# Patient Record
Sex: Female | Born: 1990 | Race: Black or African American | Hispanic: No | Marital: Single | Smoking: Never smoker
Health system: Southern US, Community
[De-identification: ages and names within clinical notes are randomized; demographics above are authoritative.]

---

## 2014-03-14 ENCOUNTER — Emergency Department (HOSPITAL_COMMUNITY)
Admission: EM | Admit: 2014-03-14 | Discharge: 2014-03-15 | Disposition: A | Discharge status: Home / Self Care | Payer: BC Managed Care – PPO | Attending: Emergency Medicine | Admitting: Emergency Medicine

## 2014-03-14 ENCOUNTER — Encounter (HOSPITAL_COMMUNITY): Payer: Self-pay

## 2014-03-14 DIAGNOSIS — S01112A Laceration without foreign body of left eyelid and periocular area, initial encounter: Secondary | ICD-10-CM | POA: Insufficient documentation

## 2014-03-14 DIAGNOSIS — S0280XA Fracture of other specified skull and facial bones, unspecified side, initial encounter for closed fracture: Secondary | ICD-10-CM

## 2014-03-14 DIAGNOSIS — S028XXA Fractures of other specified skull and facial bones, initial encounter for closed fracture: Secondary | ICD-10-CM | POA: Diagnosis not present

## 2014-03-14 DIAGNOSIS — IMO0002 Reserved for concepts with insufficient information to code with codable children: Secondary | ICD-10-CM

## 2014-03-14 DIAGNOSIS — S022XXA Fracture of nasal bones, initial encounter for closed fracture: Secondary | ICD-10-CM | POA: Diagnosis not present

## 2014-03-14 DIAGNOSIS — S0083XA Contusion of other part of head, initial encounter: Secondary | ICD-10-CM

## 2014-03-14 DIAGNOSIS — T1490XA Injury, unspecified, initial encounter: Secondary | ICD-10-CM

## 2014-03-14 DIAGNOSIS — S0285XA Fracture of orbit, unspecified, initial encounter for closed fracture: Secondary | ICD-10-CM

## 2014-03-14 MED ORDER — LIDOCAINE HCL 2 % IJ SOLN
20.0000 mL | Freq: Once | INTRAMUSCULAR | Status: DC
  Filled 2014-03-14 (×2): qty 20
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
## 2014-03-15 ENCOUNTER — Emergency Department (HOSPITAL_COMMUNITY): Payer: BC Managed Care – PPO

## 2014-03-15 MED ORDER — OXYCODONE-ACETAMINOPHEN 5-325 MG PO TABS
2.0000 | ORAL_TABLET | Freq: Once | ORAL | Status: AC
  Administered 2014-03-15: 2 via ORAL
  Filled 2014-03-15: qty 2

## 2014-03-15 MED ORDER — CEPHALEXIN 250 MG PO CAPS
250.0000 mg | ORAL_CAPSULE | Freq: Once | ORAL | Status: AC
  Administered 2014-03-15: 250 mg via ORAL
  Filled 2014-03-15: qty 1

## 2014-03-15 MED ORDER — OXYCODONE-ACETAMINOPHEN 5-325 MG PO TABS: 1.0000 | ORAL_TABLET | Freq: Four times a day (QID) | ORAL | Status: AC | PRN

## 2014-03-15 MED ORDER — CEPHALEXIN 250 MG PO CAPS: 250.0000 mg | ORAL_CAPSULE | Freq: Three times a day (TID) | ORAL | Status: AC

## 2014-03-15 NOTE — Discharge Instructions (Signed)
Assault, General °Assault includes any behavior, whether intentional or reckless, which results in bodily injury to another person and/or damage to property. Included in this would be any behavior, intentional or reckless, that by its nature would be understood (interpreted) by a reasonable person as intent to harm another person or to damage his/her property. Threats may be oral or written. They may be communicated through regular mail, computer, fax, or phone. These threats may be direct or implied. °FORMS OF ASSAULT INCLUDE: °· Physically assaulting a person. This includes physical threats to inflict physical harm as well as: °¨ Slapping. °¨ Hitting. °¨ Poking. °¨ Kicking. °¨ Punching. °¨ Pushing. °· Arson. °· Sabotage. °· Equipment vandalism. °· Damaging or destroying property. °· Throwing or hitting objects. °· Displaying a weapon or an object that appears to be a weapon in a threatening manner. °¨ Carrying a firearm of any kind. °¨ Using a weapon to harm someone. °· Using greater physical size/strength to intimidate another. °¨ Making intimidating or threatening gestures. °¨ Bullying. °¨ Hazing. °· Intimidating, threatening, hostile, or abusive language directed toward another person. °¨ It communicates the intention to engage in violence against that person. And it leads a reasonable person to expect that violent behavior may occur. °· Stalking another person. °IF IT HAPPENS AGAIN: °· Immediately call for emergency help (911 in [place] °· If someone poses clear and immediate danger to you, seek legal authorities to have a protective or restraining order put in place. °· Less threatening assaults can at least be reported to authorities. °STEPS TO TAKE IF A SEXUAL ASSAULT HAS HAPPENED °· Go to an area of safety. This may include a shelter or staying with a friend. Stay away from the area where you have been attacked. A large percentage of sexual assaults are caused by a friend, relative or associate. °· If  medications were given by your caregiver, take them as directed for the full length of time prescribed. °· Only take over-the-counter or prescription medicines for pain, discomfort, or fever as directed by your caregiver. °· If you have come in contact with a sexual disease, find out if you are to be tested again. If your caregiver is concerned about the HIV/AIDS virus, he/she may require you to have continued testing for several months. °· For the protection of your privacy, test results can not be given over the phone. Make sure you receive the results of your test. If your test results are not back during your visit, make an appointment with your caregiver to find out the results. Do not assume everything is normal if you have not heard from your caregiver or the medical facility. It is important for you to follow up on all of your test results. °· File appropriate papers with authorities. This is important in all assaults, even if it has occurred in a family or by a friend. °SEEK MEDICAL CARE IF: °· You have new problems because of your injuries. °· You have problems that may be because of the medicine you are taking, such as: °¨ Rash. °¨ Itching. °¨ Swelling. °¨ Trouble breathing. °· You develop belly (abdominal) pain, feel sick to your stomach (nausea) or are vomiting. °· You begin to run a temperature. °· You need supportive care or referral to a rape crisis center. These are centers with trained personnel who can help you get through this ordeal. °SEEK IMMEDIATE MEDICAL CARE IF: °· You are afraid of being threatened, beaten, or abused. In [place] call 911. °· You   receive new injuries related to abuse.  You develop severe pain in any area injured in the assault or have any change in your condition that concerns you.  You faint or lose consciousness.  You develop chest pain or shortness of breath. Document Released: 04/29/2005 Document Revised: 07/22/2011 Document Reviewed: 12/16/2007 [facility] Patient  Information 2015 [place], [place]. This information is not intended to replace advice given to you by your health care provider. Make sure you discuss any questions you have with your health care provider. As discussed you have a fractured nose as well as a fracture in the base of your left orbit.  This will need to be followed by ENT as well as ophthalmology.  Both please make sure to call both forward appointment for follow-up U been prescribed Percocet for pain control as well as Keflex to prevent any infection.  Due to the nasal bone fractures if at anytime you develop fever, headaches, visual problems, worsening symptoms please return to the emergency room for further evaluation
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
